# Patient Record
Sex: Female | Born: 1985 | Race: Black or African American | Hispanic: No | Marital: Married | Smoking: Never smoker
Health system: Southern US, Community
[De-identification: ages and names within clinical notes are randomized; demographics above are authoritative.]

## PROBLEM LIST (undated history)

## (undated) ENCOUNTER — Emergency Department (HOSPITAL_COMMUNITY): Discharge status: Absent without leave | Payer: No Typology Code available for payment source

## (undated) DIAGNOSIS — Z789 Other specified health status: Secondary | ICD-10-CM

---

## 2015-05-29 ENCOUNTER — Encounter (HOSPITAL_COMMUNITY): Payer: Self-pay | Admitting: Emergency Medicine

## 2015-05-29 ENCOUNTER — Emergency Department (HOSPITAL_COMMUNITY)
Admission: EM | Admit: 2015-05-29 | Discharge: 2015-05-29 | Disposition: A | Discharge status: Home / Self Care | Payer: Medicaid Other | Attending: Emergency Medicine | Admitting: Emergency Medicine

## 2015-05-29 DIAGNOSIS — R21 Rash and other nonspecific skin eruption: Secondary | ICD-10-CM | POA: Diagnosis present

## 2015-05-29 DIAGNOSIS — Y9389 Activity, other specified: Secondary | ICD-10-CM | POA: Diagnosis not present

## 2015-05-29 DIAGNOSIS — W57XXXA Bitten or stung by nonvenomous insect and other nonvenomous arthropods, initial encounter: Secondary | ICD-10-CM | POA: Insufficient documentation

## 2015-05-29 DIAGNOSIS — S40861A Insect bite (nonvenomous) of right upper arm, initial encounter: Secondary | ICD-10-CM | POA: Insufficient documentation

## 2015-05-29 DIAGNOSIS — Y998 Other external cause status: Secondary | ICD-10-CM | POA: Insufficient documentation

## 2015-05-29 DIAGNOSIS — Y9289 Other specified places as the place of occurrence of the external cause: Secondary | ICD-10-CM | POA: Diagnosis not present

## 2015-05-29 DIAGNOSIS — S40862A Insect bite (nonvenomous) of left upper arm, initial encounter: Secondary | ICD-10-CM | POA: Insufficient documentation

## 2015-05-29 MED ORDER — HYDROCORTISONE 2.5 % EX LOTN: TOPICAL_LOTION | Freq: Two times a day (BID) | CUTANEOUS | Status: DC

## 2015-05-29 NOTE — ED Notes (Signed)
Declined W/C at D/C and was escorted to lobby by RN. 

## 2015-05-29 NOTE — Discharge Instructions (Signed)

## 2015-05-29 NOTE — ED Provider Notes (Signed)
CSN: [id number]     Arrival date & time 05/29/15  1126 History  This chart was scribed for non-physician practitioner, Will Marijo File, PA-C, working with Pricilla Loveless, MD, by Ronney Lion, ED Scribe. This patient was seen in room TR07C/TR07C and the patient's care was started at 11:51 AM.    Chief Complaint  Patient presents with  . Rash   The history is provided by the patient. A language interpreter was used (tele-interpreter).    HPI Comments: Melanie Duncan is a 29 y.o. female who presents to the Emergency Department complaining of two constant pruritic bumps--one on her right upper arm and one on her left upper arm--that she first noticed yesterday while at work. They are not painful. No other rashes.  Patient has not tried any treatments for this. She denies any fever, myalgias, pain, SOB, sore throat, or abdominal pain.   History reviewed. No pertinent past medical history. History reviewed. No pertinent past surgical history. No family history on file. Social History  Substance Use Topics  . Smoking status: Never Smoker   . Smokeless tobacco: None  . Alcohol Use: No   OB History    No data available     Review of Systems  Constitutional: Negative for fever.  HENT: Negative for sore throat and trouble swallowing.   Eyes: Negative for visual disturbance.  Respiratory: Negative for cough and shortness of breath.   Gastrointestinal: Negative for abdominal pain.  Musculoskeletal: Negative for myalgias and arthralgias.  Skin: Positive for rash.   Allergies  Review of patient's allergies indicates not on file.  Home Medications   Prior to Admission medications   Medication Sig Start Date End Date Taking? Authorizing Provider  hydrocortisone 2.5 % lotion Apply topically 2 (two) times daily. 05/29/15   Everlene Farrier, PA-C   BP 127/72 mmHg  Pulse 79  Temp(Src) 98.5 F (36.9 C) (Oral)  Resp 18  Ht 5' (1.524 m)  Wt 153 lb (69.4 kg)  BMI 29.88 kg/m2  SpO2 96% Physical Exam   Constitutional: She appears well-developed and well-nourished. No distress.  Nontoxic appearing.  HENT:  Head: Normocephalic and atraumatic.  Right Ear: External ear normal.  Left Ear: External ear normal.  Mouth/Throat: Oropharynx is clear and moist.  Eyes: Conjunctivae are normal. Pupils are equal, round, and reactive to light. Right eye exhibits no discharge. Left eye exhibits no discharge.  Neck: Normal range of motion.  Cardiovascular: Normal rate, regular rhythm, normal heart sounds and intact distal pulses.   Pulmonary/Chest: Effort normal and breath sounds normal. No respiratory distress. She has no wheezes. She has no rales.  Abdominal: Soft. There is no tenderness. There is no guarding.  Musculoskeletal: She exhibits no edema.  Lymphadenopathy:    She has no cervical adenopathy.  Neurological: She is alert. Coordination normal.  Skin: Skin is warm and dry. No rash noted. She is not diaphoretic. No pallor.  Two areas which appear to be mosquito bites to her bilateral arms. Raised areas of induration about 1 cm in size with overlying erythema and no surrounding erythema. No discharge. No vesicles. Not tender to touch.   Psychiatric: She has a normal mood and affect. Her behavior is normal.  Nursing note and vitals reviewed.   ED Course  Procedures (including critical care time)  DIAGNOSTIC STUDIES: Oxygen Saturation is 96% on RA, normal by my interpretation.    COORDINATION OF CARE: 11:56 AM - Suspect mosquito bites. Discussed treatment plan with pt at bedside which includes Rx hydrocortisone  cream. Will give referral to [facility] where pt can establish can establish primary care. Pt has no further questions at this time; she verbalized understanding and agreed to plan.   MDM   Meds given in ED:  Medications - No data to display  New Prescriptions   HYDROCORTISONE 2.5 % LOTION    Apply topically 2 (two) times daily.    Final diagnoses:  Insect bite   This is  a 29 y.o. female who presents to the Emergency Department complaining of two constant pruritic bumps--one on her right upper arm and one on her left upper arm--that she first noticed yesterday while at work. They are not painful. No other rashes.  On exam the patient is afebrile nontoxic appearing. She has what appears to be 2 mosquito bites on her bilateral arms. They're raised areas of induration with overlying erythema without surrounding erythema. No discharge. No vesicles.  No blisters, no pustules, no warmth, no draining sinus tracts, no superficial abscesses, no bullous impetigo, no vesicles, no desquamation, no target lesions with dusky purpura or a central bulla. Not tender to touch.  Will discharge with hydrocortisone cream and have her follow-up with primary care. I advised the patient to follow-up with their primary care provider this week. I advised the patient to return to the emergency department with new or worsening symptoms or new concerns. The patient verbalized understanding and agreement with plan.    I personally performed the services described in this documentation, which was scribed in my presence. The recorded information has been reviewed and is accurate.    Everlene Farrier, PA-C 05/29/15 1213  Pricilla Loveless, MD 06/01/15 1630

## 2015-06-15 ENCOUNTER — Emergency Department (HOSPITAL_COMMUNITY): Payer: No Typology Code available for payment source

## 2015-06-15 ENCOUNTER — Encounter (HOSPITAL_COMMUNITY): Payer: Self-pay | Admitting: Family Medicine

## 2015-06-15 ENCOUNTER — Emergency Department (HOSPITAL_COMMUNITY)
Admission: EM | Admit: 2015-06-15 | Discharge: 2015-06-15 | Disposition: A | Discharge status: Home / Self Care | Payer: No Typology Code available for payment source | Attending: Emergency Medicine | Admitting: Emergency Medicine

## 2015-06-15 DIAGNOSIS — S0993XA Unspecified injury of face, initial encounter: Secondary | ICD-10-CM | POA: Diagnosis not present

## 2015-06-15 DIAGNOSIS — Y9389 Activity, other specified: Secondary | ICD-10-CM | POA: Diagnosis not present

## 2015-06-15 DIAGNOSIS — Y92410 Unspecified street and highway as the place of occurrence of the external cause: Secondary | ICD-10-CM | POA: Insufficient documentation

## 2015-06-15 DIAGNOSIS — Z3202 Encounter for pregnancy test, result negative: Secondary | ICD-10-CM | POA: Insufficient documentation

## 2015-06-15 DIAGNOSIS — Y998 Other external cause status: Secondary | ICD-10-CM | POA: Insufficient documentation

## 2015-06-15 LAB — I-STAT BETA HCG BLOOD, ED (MC, WL, AP ONLY)

## 2015-06-15 LAB — POC URINE PREG, ED: PREG TEST UR: NEGATIVE

## 2015-06-15 MED ORDER — ACETAMINOPHEN 500 MG PO TABS
1000.0000 mg | ORAL_TABLET | Freq: Once | ORAL | Status: AC
  Administered 2015-06-15: 1000 mg via ORAL
  Filled 2015-06-15: qty 2

## 2015-06-15 MED ORDER — METHOCARBAMOL 500 MG PO TABS: 500.0000 mg | ORAL_TABLET | Freq: Two times a day (BID) | ORAL | Status: DC

## 2015-06-15 MED ORDER — MELOXICAM 7.5 MG PO TABS: 7.5000 mg | ORAL_TABLET | Freq: Every day | ORAL | Status: DC

## 2015-06-15 NOTE — Discharge Instructions (Signed)
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
## 2015-07-31 ENCOUNTER — Encounter (HOSPITAL_COMMUNITY): Payer: Self-pay | Admitting: Neurology

## 2015-07-31 ENCOUNTER — Emergency Department (HOSPITAL_COMMUNITY): Payer: Medicaid Other

## 2015-07-31 ENCOUNTER — Emergency Department (HOSPITAL_COMMUNITY)
Admission: EM | Admit: 2015-07-31 | Discharge: 2015-07-31 | Disposition: A | Discharge status: Home / Self Care | Payer: Medicaid Other | Attending: Emergency Medicine | Admitting: Emergency Medicine

## 2015-07-31 DIAGNOSIS — R21 Rash and other nonspecific skin eruption: Secondary | ICD-10-CM | POA: Insufficient documentation

## 2015-07-31 DIAGNOSIS — N390 Urinary tract infection, site not specified: Secondary | ICD-10-CM | POA: Diagnosis not present

## 2015-07-31 DIAGNOSIS — H00036 Abscess of eyelid left eye, unspecified eyelid: Secondary | ICD-10-CM | POA: Diagnosis not present

## 2015-07-31 DIAGNOSIS — H05012 Cellulitis of left orbit: Secondary | ICD-10-CM

## 2015-07-31 DIAGNOSIS — R1030 Lower abdominal pain, unspecified: Secondary | ICD-10-CM | POA: Diagnosis present

## 2015-07-31 DIAGNOSIS — L03213 Periorbital cellulitis: Secondary | ICD-10-CM

## 2015-07-31 LAB — CBC
HCT: 37.8 % (ref 36.0–46.0)
HEMOGLOBIN: 12.8 g/dL (ref 12.0–15.0)
MCH: 26.1 pg (ref 26.0–34.0)
MCHC: 33.9 g/dL (ref 30.0–36.0)
MCV: 77.1 fL — ABNORMAL LOW (ref 78.0–100.0)
Platelets: 248 10*3/uL (ref 150–400)
RBC: 4.9 MIL/uL (ref 3.87–5.11)
RDW: 12.2 % (ref 11.5–15.5)
WBC: 6.6 10*3/uL (ref 4.0–10.5)

## 2015-07-31 LAB — URINE MICROSCOPIC-ADD ON

## 2015-07-31 LAB — COMPREHENSIVE METABOLIC PANEL
ALK PHOS: 85 U/L (ref 38–126)
ALT: 15 U/L (ref 14–54)
ANION GAP: 6 (ref 5–15)
AST: 21 U/L (ref 15–41)
Albumin: 3.7 g/dL (ref 3.5–5.0)
BILIRUBIN TOTAL: 1 mg/dL (ref 0.3–1.2)
BUN: <5 mg/dL — ABNORMAL LOW (ref 6–20)
CALCIUM: 8.9 mg/dL (ref 8.9–10.3)
CO2: 24 mmol/L (ref 22–32)
Chloride: 106 mmol/L (ref 101–111)
Creatinine, Ser: 0.71 mg/dL (ref 0.44–1.00)
GFR calc non Af Amer: >60 mL/min (ref >60)
Glucose, Bld: 103 mg/dL — ABNORMAL HIGH (ref 65–99)
Potassium: 4 mmol/L (ref 3.5–5.1)
SODIUM: 136 mmol/L (ref 135–145)
TOTAL PROTEIN: 6.6 g/dL (ref 6.5–8.1)

## 2015-07-31 LAB — URINALYSIS, ROUTINE W REFLEX MICROSCOPIC
Bilirubin Urine: NEGATIVE
Glucose, UA: NEGATIVE mg/dL
Hgb urine dipstick: NEGATIVE
Ketones, ur: NEGATIVE mg/dL
NITRITE: NEGATIVE
Protein, ur: NEGATIVE mg/dL
SPECIFIC GRAVITY, URINE: 1.009 (ref 1.005–1.030)
pH: 6.5 (ref 5.0–8.0)

## 2015-07-31 LAB — LIPASE, BLOOD: Lipase: 36 U/L (ref 11–51)

## 2015-07-31 LAB — I-STAT BETA HCG BLOOD, ED (MC, WL, AP ONLY)

## 2015-07-31 MED ORDER — SULFAMETHOXAZOLE-TRIMETHOPRIM 800-160 MG PO TABS
1.0000 | ORAL_TABLET | Freq: Once | ORAL | Status: AC
  Administered 2015-07-31: 1 via ORAL
  Filled 2015-07-31: qty 1

## 2015-07-31 MED ORDER — SODIUM CHLORIDE 0.9 % IV BOLUS (SEPSIS)
500.0000 mL | Freq: Once | INTRAVENOUS | Status: AC
  Administered 2015-07-31: 500 mL via INTRAVENOUS

## 2015-07-31 MED ORDER — IOHEXOL 300 MG/ML  SOLN
75.0000 mL | Freq: Once | INTRAMUSCULAR | Status: AC | PRN
Start: 2015-07-31 — End: 2015-07-31
  Administered 2015-07-31: 75 mL via INTRAVENOUS

## 2015-07-31 MED ORDER — KETOROLAC TROMETHAMINE 30 MG/ML IJ SOLN
30.0000 mg | Freq: Once | INTRAMUSCULAR | Status: AC
  Administered 2015-07-31: 30 mg via INTRAVENOUS
  Filled 2015-07-31: qty 1

## 2015-07-31 MED ORDER — HYDROCORTISONE 2.5 % EX LOTN: TOPICAL_LOTION | Freq: Two times a day (BID) | CUTANEOUS | Status: DC

## 2015-07-31 MED ORDER — SULFAMETHOXAZOLE-TRIMETHOPRIM 800-160 MG PO TABS: 1.0000 | ORAL_TABLET | Freq: Two times a day (BID) | ORAL | Status: AC

## 2015-07-31 MED ORDER — DIPHENHYDRAMINE HCL 50 MG/ML IJ SOLN
12.5000 mg | Freq: Once | INTRAMUSCULAR | Status: AC
  Administered 2015-07-31: 12.5 mg via INTRAVENOUS
  Filled 2015-07-31: qty 1

## 2015-07-31 NOTE — ED Notes (Signed)
MD at bedside, discussing discharge instructions with patient.

## 2015-07-31 NOTE — Discharge Instructions (Signed)
You were seen today for your eye swelling, lower abdominal pain, and rash.  Take the antibiotics as prescribed for the infection of the skin around your eye as well as your urinary tract infection.  Use baby wash to clean your eye lashes on the eye that has the surrounding infection.  Use the cream prescribed for your rash.  Make sure you follow up with a primary care physician outpatient for reevaluation to make sure your symptoms are improving.  Preseptal Cellulitis, Adult Preseptal cellulitis--also called periorbital cellulitis--is an infection that can affect your eyelid and the soft tissues or skin that surround your eye. The infection may also affect the structures that produce and drain your tears. It does not affect your eye itself. CAUSES This condition may be caused by:  Bacterial infection.  Long-term (chronic) sinus infections.  An object (foreign body) that is stuck behind the eye.  An injury that:  Goes through the eyelid tissues.  Causes an infection, such as an insect sting.  Fracture of the bone around the eye.  Infections that have spread from the eyelid or other structures around the eye.  Bite wounds.  Inflammation or infection of the lining membranes of the brain (meningitis).  An infection in the blood (septicemia).  Dental infection (abscess).  Viral infection. This is rare. RISK FACTORS Risk factors for preseptal cellulitis include:  Participating in activities that increase your risk of trauma to the face or head, such as boxing or high-speed activities.  Having a weakened defense system (immune system).  Medical conditions, such as nasal polyps, that increase your risk for frequent or recurrent sinus infections.  Not receiving regular dental care. SYMPTOMS Symptoms of this condition usually come on suddenly. Symptoms may include:  Red, hot, and swollen eyelids.  Fever.  Difficulty opening your eye.  Eye pain. DIAGNOSIS This condition may  be diagnosed by an eye exam. You may also have tests, such as:  Blood tests.  CT scan.  MRI.  Spinal tap (lumbar puncture). This is a procedure that involves removing and examining a small amount of the fluid that surrounds the brain and spinal cord. This checks for meningitis. TREATMENT Treatment for this condition will include antibiotic medicines. These may be given by mouth (orally), through an IV, or as a shot. Your health care provider may also recommend nasal decongestants to reduce swelling. HOME CARE INSTRUCTIONS  Take your antibiotic medicine as directed by your health care provider. Finish all of it even if you start to feel better.  Take medicines only as directed by your health care provider.  Drink enough fluid to keep your urine clear or pale yellow.  Do not use any tobacco products, including cigarettes, chewing tobacco, or electronic cigarettes. If you need help quitting, ask your health care provider.  Keep all follow-up visits as directed by your health care provider. These include any visits with an eye specialist (ophthalmologist) or dentist. SEEK MEDICAL CARE IF:  You have a fever.  Your eyelids become more red, warm, or swollen.  You have new symptoms.  Your symptoms do not get better with treatment. SEEK IMMEDIATE MEDICAL CARE IF:  You develop double vision, or your vision becomes blurred or worsens in any way.  You have trouble moving your eyes.  Your eye looks like it is sticking out or bulging out (proptosis).  You develop a severe headache, severe neck pain, or neck stiffness.  You develop repeated vomiting.   This information is not intended to replace  advice given to you by your health care provider. Make sure you discuss any questions you have with your health care provider.   Document Released: 09/23/2010 Document Revised: 01/05/2015 Document Reviewed: 08/17/2014 Elsevier Interactive Patient Education 2016 Else[employer] Inc[employer]    Urinary  Tract Infection Urinary tract infections (UTIs) can develop anywhere along your urinary tract. Your urinary tract is your body's drainage system for removing wastes and extra water. Your urinary tract includes two kidneys, two ureters, a bladder, and a urethra. Your kidneys are a pair of bean-shaped organs. Each kidney is about the size of your fist. They are located below your ribs, one on each side of your spine. CAUSES Infections are caused by microbes, which are microscopic organisms, including fungi, viruses, and bacteria. These organisms are so small that they can only be seen through a microscope. Bacteria are the microbes that most commonly cause UTIs. SYMPTOMS  Symptoms of UTIs may vary by age and gender of the patient and by the location of the infection. Symptoms in young women typically include a frequent and intense urge to urinate and a painful, burning feeling in the bladder or urethra during urination. Older women and men are more likely to be tired, shaky, and weak and have muscle aches and abdominal pain. A fever may mean the infection is in your kidneys. Other symptoms of a kidney infection include pain in your back or sides below the ribs, nausea, and vomiting. DIAGNOSIS To diagnose a UTI, your caregiver will ask you about your symptoms. Your caregiver will also ask you to provide a urine sample. The urine sample will be tested for bacteria and white blood cells. White blood cells are made by your body to help fight infection. TREATMENT  Typically, UTIs can be treated with medication. Because most UTIs are caused by a bacterial infection, they usually can be treated with the use of antibiotics. The choice of antibiotic and length of treatment depend on your symptoms and the type of bacteria causing your infection. HOME CARE INSTRUCTIONS  If you were prescribed antibiotics, take them exactly as your caregiver instructs you. Finish the medication even if you feel better after you have  only taken some of the medication.  Drink enough water and fluids to keep your urine clear or pale yellow.  Avoid caffeine, tea, and carbonated beverages. They tend to irritate your bladder.  Empty your bladder often. Avoid holding urine for long periods of time.  Empty your bladder before and after sexual intercourse.  After a bowel movement, women should cleanse from front to back. Use each tissue only once. SEEK MEDICAL CARE IF:   You have back pain.  You develop a fever.  Your symptoms do not begin to resolve within 3 days. SEEK IMMEDIATE MEDICAL CARE IF:   You have severe back pain or lower abdominal pain.  You develop chills.  You have nausea or vomiting.  You have continued burning or discomfort with urination. MAKE SURE YOU:   Understand these instructions.  Will watch your condition.  Will get help right away if you are not doing well or get worse.   This information is not intended to replace advice given to you by your health care provider. Make sure you discuss any questions you have with your health care provider.   Document Released: 05/31/2005 Document Revised: 05/12/2015 Document Reviewed: 09/29/2011 Elsevier Interactive Patient Education 2016 Else[employer] Inc.  Eczema Eczema, also called atopic dermatitis, is a skin disorder that causes inflammation of the skin.  It causes a red rash and dry, scaly skin. The skin becomes very itchy. Eczema is generally worse during the cooler winter months and often improves with the warmth of summer. Eczema usually starts showing signs in infancy. Some children outgrow eczema, but it may last through adulthood.  CAUSES  The exact cause of eczema is not known, but it appears to run in families. People with eczema often have a family history of eczema, allergies, asthma, or hay fever. Eczema is not contagious. Flare-ups of the condition may be caused by:   Contact with something you are sensitive or allergic to.    Stress. SIGNS AND SYMPTOMS  Dry, scaly skin.   Red, itchy rash.   Itchiness. This may occur before the skin rash and may be very intense.  DIAGNOSIS  The diagnosis of eczema is usually made based on symptoms and medical history. TREATMENT  Eczema cannot be cured, but symptoms usually can be controlled with treatment and other strategies. A treatment plan might include:  Controlling the itching and scratching.   Use over-the-counter antihistamines as directed for itching. This is especially useful at night when the itching tends to be worse.   Use over-the-counter steroid creams as directed for itching.   Avoid scratching. Scratching makes the rash and itching worse. It may also result in a skin infection (impetigo) due to a break in the skin caused by scratching.   Keeping the skin well moisturized with creams every day. This will seal in moisture and help prevent dryness. Lotions that contain alcohol and water should be avoided because they can dry the skin.   Limiting exposure to things that you are sensitive or allergic to (allergens).   Recognizing situations that cause stress.   Developing a plan to manage stress.  HOME CARE INSTRUCTIONS   Only take over-the-counter or prescription medicines as directed by your health care provider.   Do not use anything on the skin without checking with your health care provider.   Keep baths or showers short (5 minutes) in warm (not hot) water. Use mild cleansers for bathing. These should be unscented. You may add nonperfumed bath oil to the bath water. It is best to avoid soap and bubble bath.   Immediately after a bath or shower, when the skin is still damp, apply a moisturizing ointment to the entire body. This ointment should be a petroleum ointment. This will seal in moisture and help prevent dryness. The thicker the ointment, the better. These should be unscented.   Keep fingernails cut short. Children with  eczema may need to wear soft gloves or mittens at night after applying an ointment.   Dress in clothes made of cotton or cotton blends. Dress lightly, because heat increases itching.   A child with eczema should stay away from anyone with fever blisters or cold sores. The virus that causes fever blisters (herpes simplex) can cause a serious skin infection in children with eczema. SEEK MEDICAL CARE IF:   Your itching interferes with sleep.   Your rash gets worse or is not better within 1 week after starting treatment.   You see pus or soft yellow scabs in the rash area.   You have a fever.   You have a rash flare-up after contact with someone who has fever blisters.    This information is not intended to replace advice given to you by your health care provider. Make sure you discuss any questions you have with your health care provider.  Document Released: 08/18/2000 Document Revised: 06/11/2013 Document Reviewed: 03/24/2013 Elsevier Interactive Patient Education [employer]

## 2015-07-31 NOTE — ED Notes (Signed)
MD discussed with patient the discharge instructions and medications while on phone with interpreter. Patient verbalized understanding of instructions and prescriptions and denies any further needs or questions at this time. VS stable.

## 2015-07-31 NOTE — ED Notes (Signed)
MD at bedside. On phone with interpreter at this time.

## 2015-07-31 NOTE — ED Provider Notes (Signed)
CSN: [id number]     Arrival date & time 07/31/15  1131 History   First MD Initiated Contact with Patient 07/31/15 1150     Chief Complaint  Patient presents with  . Abdominal Pain  . Rash  . Eye Problem     (Consider location/radiation/quality/duration/timing/severity/associated sxs/prior Treatment) HPI Comments: 29 y.o. Female with no significant past medical history, Swahili speaking presents for abdominal pain, eye swelling, rash.  The patient reports that since yesterday she has had pain across her lower abdomen.  Denies change in appetite, fever, diarrhea, nausea, vomiting.  Reports she thinks she may be pregnant because she denies menstrual period since August.  Denies vaginal discharge or pain.  She also reports a recurrent, itchy rash to her right posterior thigh, right hand, and right arm.  Also over the last two or so days she has developed swelling of the eyelids of her eye without change in vision or pain in the eye.  She reports that her son had something similar happen to him.     History reviewed. No pertinent past medical history. Past Surgical History  Procedure Laterality Date  . Cesarean section     No family history on file. Social History  Substance Use Topics  . Smoking status: Never Smoker   . Smokeless tobacco: None  . Alcohol Use: No   OB History    No data available     Review of Systems  Constitutional: Negative for fever, chills, diaphoresis, appetite change and fatigue.  HENT: Positive for facial swelling (left eyelid). Negative for congestion, ear pain, postnasal drip, sinus pressure and sneezing.   Eyes: Positive for itching. Negative for photophobia, pain, discharge, redness and visual disturbance.  Respiratory: Negative for cough, chest tightness and shortness of breath.   Cardiovascular: Negative for chest pain and palpitations.  Gastrointestinal: Positive for abdominal pain (lower abdomen without focal area of pain). Negative for nausea,  vomiting, diarrhea, constipation and abdominal distention.  Genitourinary: Positive for frequency. Negative for dysuria, urgency, hematuria and flank pain.  Musculoskeletal: Negative for myalgias and back pain.  Skin: Positive for rash.  Neurological: Negative for dizziness, seizures, weakness, numbness and headaches.  Hematological: Does not bruise/bleed easily.      Allergies  Review of patient's allergies indicates no known allergies.  Home Medications   Prior to Admission medications   Medication Sig Start Date End Date Taking? Authorizing Provider  hydrocortisone 2.5 % lotion Apply topically 2 (two) times daily. Patient not taking: Reported on 06/15/2015 05/29/15   Everlene Farrier, PA-C  meloxicam (MOBIC) 7.5 MG tablet Take 1 tablet (7.5 mg total) by mouth daily. Patient not taking: Reported on 07/31/2015 06/15/15   April Palumbo, MD  methocarbamol (ROBAXIN) 500 MG tablet Take 1 tablet (500 mg total) by mouth 2 (two) times daily. Patient not taking: Reported on 07/31/2015 06/15/15   April Palumbo, MD   BP 109/59 mmHg  Pulse 66  Temp(Src) 98.7 F (37.1 C) (Oral)  Resp 14  SpO2 99%  LMP 04/05/2015 Physical Exam  Constitutional: She is oriented to person, place, and time. She appears well-developed and well-nourished. No distress.  HENT:  Head: Normocephalic and atraumatic.  Right Ear: External ear normal.  Left Ear: External ear normal.  Nose: Nose normal.  Mouth/Throat: Oropharynx is clear and moist. No oropharyngeal exudate.  Eyes: Conjunctivae and EOM are normal. Pupils are equal, round, and reactive to light. Right eye exhibits no chemosis, no discharge, no exudate and no hordeolum. Left eye exhibits no chemosis,  no discharge, no exudate and no hordeolum.  Neck: Normal range of motion. Neck supple.  Cardiovascular: Normal rate, regular rhythm, normal heart sounds and intact distal pulses.   No murmur heard. Pulmonary/Chest: Effort normal. No respiratory distress. She  has no wheezes. She has no rales.  Abdominal: Soft. She exhibits no distension. There is no tenderness.  Musculoskeletal: Normal range of motion. She exhibits no edema or tenderness.  Neurological: She is alert and oriented to person, place, and time.  Skin: Skin is warm and dry. Rash (patches of dry skin with numular lesions over the right dorsum of the hand, left arm, and right posterior thigh) noted. She is not diaphoretic.  Vitals reviewed.   ED Course  Procedures (including critical care time) Labs Review Labs Reviewed  COMPREHENSIVE METABOLIC PANEL - Abnormal; Notable for the following:    Glucose, Bld 103 (*)    BUN <5 (*)    All other components within normal limits  CBC - Abnormal; Notable for the following:    MCV 77.1 (*)    All other components within normal limits  URINALYSIS, ROUTINE W REFLEX MICROSCOPIC (NOT AT [facility]) - Abnormal; Notable for the following:    APPearance CLOUDY (*)    Leukocytes, UA TRACE (*)    All other components within normal limits  URINE MICROSCOPIC-ADD ON - Abnormal; Notable for the following:    Squamous Epithelial / LPF 6-30 (*)    Bacteria, UA RARE (*)    All other components within normal limits  LIPASE, BLOOD  I-STAT BETA HCG BLOOD, ED ([facility], [facility], [facility] ONLY)    Imaging Review Dg Abd Acute W/chest  07/31/2015  CLINICAL DATA:  Generalized abdominal pain EXAM: DG ABDOMEN ACUTE W/ 1V CHEST COMPARISON:  None. FINDINGS: The cardiac silhouette. There is mild bronchitic markings. No focal infiltrate. No pneumothorax. No free air beneath hemidiaphragms. Moderate volume stool in the RIGHT colon. No dilated loops of large or small bowel. Gas in the rectosigmoid colon. No pathologic calcifications. IMPRESSION: 1.  No acute cardiopulmonary process. 2. No bowel obstruction or free air. 3. Moderate stool in the RIGHT colon. Electronically Signed   By: Genevive BiStewart  Edmunds M.D.   On: 07/31/2015 13:18   Ct Orbits W/cm  07/31/2015  CLINICAL DATA:  Cellulitis about  the left eyelid with swelling and pain beginning the night of 07/30/2015. Initial encounter. EXAM: CT ORBITS WITH CONTRAST TECHNIQUE: Multidetector CT imaging of the orbits was performed following the bolus administration of intravenous contrast. CONTRAST:  75 mL OMNIPAQUE IOHEXOL 300 MG/ML  SOLN COMPARISON:  None. FINDINGS: There soft tissue swelling of the left lid which appears worse in the upper lid. Soft tissue swelling is preseptal. Orbital fat is clear. No focal fluid collection is identified. The globes are intact and the lenses are located. Extraocular muscles and the optic nerves appear normal. Imaged paranasal sinuses and mastoid air cells are clear. No bony abnormality is identified. Imaged intracranial contents appear normal. IMPRESSION: Soft tissue swelling of the left eye appears worse in the upper lid and is compatible with cellulitis of blepharitis. Negative for abscess or postseptal abnormality. Electronically Signed   By: Drusilla Kannerhomas  Dalessio M.D.   On: 07/31/2015 15:40   I have personally reviewed and evaluated these images and lab results as part of my medical decision-making.   EKG Interpretation None      MDM  Patient was seen and evaluated in stable condition.  No tenderness appreciated on abdominal examination.  Labs unremarkable other than UA  concerning for possible UTI and patient with symptoms.  HCG negative.  Ct orbit negative for orbital cellulitis.  PAtient was prescribed Bactirm for possible UTI and preseptal cellulitis.  Her rash was consistent with numular eczema and patient was provided a script for hydrocortisone cream.  All plans of care and clinical impression were discussed at length with patient via interpreter.  Patient expressed understanding and agreement.  Patient in agreement with plan of care.  Discharged to follow up with primary care outpatient. Final diagnoses:  Orbital cellulitis on left    1. Preseptal cellulitis, left  2. Nummular eczema  3. Possible  UTI    Leta Baptist, MD 08/02/15 [phone]

## 2015-07-31 NOTE — ED Notes (Addendum)
Pt is non-english speaking. Speaks swahili using intrepreter. C/o generalized abd pain since last night. Denies n/v/d, last BM today. LMP was in august, is unsure if she is pregnant. Also has rash to right posterior upper leg and pain to right 2nd finger. Has swelling to left eye lid since last night.
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
## 2021-07-15 ENCOUNTER — Encounter (HOSPITAL_COMMUNITY): Payer: Self-pay | Admitting: Obstetrics and Gynecology

## 2021-07-15 ENCOUNTER — Other Ambulatory Visit: Payer: Self-pay

## 2021-07-15 ENCOUNTER — Inpatient Hospital Stay (HOSPITAL_COMMUNITY)
Admission: AD | Admit: 2021-07-15 | Discharge: 2021-07-15 | Disposition: A | Discharge status: Home / Self Care | Payer: Medicaid Other | Attending: Obstetrics and Gynecology | Admitting: Obstetrics and Gynecology

## 2021-07-15 DIAGNOSIS — O26892 Other specified pregnancy related conditions, second trimester: Secondary | ICD-10-CM | POA: Diagnosis present

## 2021-07-15 DIAGNOSIS — R079 Chest pain, unspecified: Secondary | ICD-10-CM | POA: Insufficient documentation

## 2021-07-15 DIAGNOSIS — K5900 Constipation, unspecified: Secondary | ICD-10-CM | POA: Insufficient documentation

## 2021-07-15 DIAGNOSIS — R059 Cough, unspecified: Secondary | ICD-10-CM | POA: Diagnosis not present

## 2021-07-15 DIAGNOSIS — O99612 Diseases of the digestive system complicating pregnancy, second trimester: Secondary | ICD-10-CM | POA: Diagnosis not present

## 2021-07-15 DIAGNOSIS — M940 Chondrocostal junction syndrome [Tietze]: Secondary | ICD-10-CM | POA: Insufficient documentation

## 2021-07-15 DIAGNOSIS — J101 Influenza due to other identified influenza virus with other respiratory manifestations: Secondary | ICD-10-CM | POA: Diagnosis not present

## 2021-07-15 DIAGNOSIS — O99512 Diseases of the respiratory system complicating pregnancy, second trimester: Secondary | ICD-10-CM | POA: Diagnosis not present

## 2021-07-15 DIAGNOSIS — Z3A20 20 weeks gestation of pregnancy: Secondary | ICD-10-CM | POA: Diagnosis not present

## 2021-07-15 DIAGNOSIS — K921 Melena: Secondary | ICD-10-CM | POA: Diagnosis not present

## 2021-07-15 DIAGNOSIS — O219 Vomiting of pregnancy, unspecified: Secondary | ICD-10-CM | POA: Insufficient documentation

## 2021-07-15 HISTORY — DX: Other specified health status: Z78.9

## 2021-07-15 LAB — URINALYSIS, ROUTINE W REFLEX MICROSCOPIC
Bilirubin Urine: NEGATIVE
Glucose, UA: NEGATIVE mg/dL
Hgb urine dipstick: NEGATIVE
Ketones, ur: 80 mg/dL — AB
Leukocytes,Ua: NEGATIVE
Nitrite: NEGATIVE
Protein, ur: NEGATIVE mg/dL
Specific Gravity, Urine: 1.008 (ref 1.005–1.030)
pH: 6 (ref 5.0–8.0)

## 2021-07-15 LAB — POCT PREGNANCY, URINE: Preg Test, Ur: POSITIVE — AB

## 2021-07-15 MED ORDER — ONDANSETRON HCL 4 MG/2ML IJ SOLN
4.0000 mg | Freq: Once | INTRAMUSCULAR | Status: AC
  Administered 2021-07-15: 4 mg via INTRAVENOUS
  Filled 2021-07-15: qty 2

## 2021-07-15 MED ORDER — LACTATED RINGERS IV BOLUS
1000.0000 mL | Freq: Once | INTRAVENOUS | Status: AC
  Administered 2021-07-15: 1000 mL via INTRAVENOUS

## 2021-07-15 MED ORDER — M.V.I. ADULT IV INJ
Freq: Once | INTRAVENOUS | Status: AC
  Filled 2021-07-15: qty 10

## 2021-07-15 MED ORDER — BENZONATATE 100 MG PO CAPS: 100.0000 mg | ORAL_CAPSULE | Freq: Three times a day (TID) | ORAL | 0 refills | Status: AC

## 2021-07-15 MED ORDER — PROMETHAZINE HCL 25 MG PO TABS
25.0000 mg | ORAL_TABLET | Freq: Four times a day (QID) | ORAL | 0 refills | Status: AC | PRN
Start: 2021-07-15 — End: ?

## 2021-07-15 NOTE — MAU Provider Note (Signed)
History     CSN: [id number]  Arrival date and time: 07/15/21 1145   Event Date/Time   First Provider Initiated Contact with Patient 07/15/21 1622      Chief Complaint  Patient presents with   Cough   Blood in stool   Chest Pain   HPI Melanie Duncan is a 35 y.o. [id number] at [redacted]w[redacted]d who presents to MAU with chief complaints of vomiting and productive cough in the setting of Flu A diagnosis yesterday. Patient states symptoms started two weeks ago. She feels as if her symptoms have worsened over time. She sought evaluation at a clinic in [facility] yesterday and was prescribed Zofran. She has been taking it BID with limited success. She is able to tolerate PO throughout the day but cannot go an entire day without vomiting.  Patient reports chest tightness during episodes of coughing. She denies chest pain. She denies palpitations, weakness, syncope.   Patient reports constipation. She has visualized blood on her stool when she wipes after bowel movements. She is unaware of interventions for constipation.  She denies abdominal pain, vaginal bleeding, dysuria, fever.   Patient receives care in [facility].  OB History     Gravida  6   Para  5   Term  5   Preterm      AB      Living  4      SAB      IAB      Ectopic      Multiple      Live Births  4           Past Medical History:  Diagnosis Date   Medical history non-contributory     Past Surgical History:  Procedure Laterality Date   CESAREAN SECTION      No family history on file.  Social History   Tobacco Use   Smoking status: Never  Substance Use Topics   Alcohol use: No   Drug use: No    Allergies: No Known Allergies  Medications Prior to Admission  Medication Sig Dispense Refill Last Dose   ondansetron (ZOFRAN-ODT) 4 MG disintegrating tablet Take 4 mg by mouth every 8 (eight) hours as needed for nausea or vomiting.      hydrocortisone 2.5 % lotion Apply topically 2 (two) times daily.  59 mL 0    meloxicam (MOBIC) 7.5 MG tablet Take 1 tablet (7.5 mg total) by mouth daily. (Patient not taking: Reported on 07/31/2015) 10 tablet 0    methocarbamol (ROBAXIN) 500 MG tablet Take 1 tablet (500 mg total) by mouth 2 (two) times daily. (Patient not taking: Reported on 07/31/2015) 20 tablet 0    sulfamethoxazole-trimethoprim (BACTRIM DS,SEPTRA DS) 800-160 MG tablet Take 1 tablet by mouth 2 (two) times daily. 20 tablet 0     Review of Systems  Constitutional:  Positive for fatigue.  Respiratory:  Positive for cough.   All other systems reviewed and are negative. Physical Exam   Blood pressure 114/66, pulse 90, temperature 98.3 F (36.8 C), temperature source Oral, resp. rate 20, weight 83 kg, last menstrual period 02/23/2021, SpO2 99 %.  Physical Exam Vitals and nursing note reviewed. Exam conducted with a chaperone present.  Constitutional:      General: She is not in acute distress.    Appearance: Normal appearance. She is obese. She is not ill-appearing.  Cardiovascular:     Rate and Rhythm: Normal rate and regular rhythm.     Pulses: Normal pulses.  Heart sounds: Normal heart sounds.  Pulmonary:     Effort: Pulmonary effort is normal.     Breath sounds: Normal breath sounds.  Abdominal:     Comments: Gravid  Skin:    Capillary Refill: Capillary refill takes less than 2 seconds.  Neurological:     Mental Status: She is alert and oriented to person, place, and time.  Psychiatric:        Mood and Affect: Mood normal.        Behavior: Behavior normal.        Thought Content: Thought content normal.        Judgment: Judgment normal.    MAU Course  Procedures  Orders Placed This Encounter  Procedures   Urinalysis, Routine w reflex microscopic Urine, Clean Catch   Droplet Isolation   Pregnancy, urine POC   Insert peripheral IV   Discharge patient   Patient Vitals for the past 24 hrs:  BP Temp Temp src Pulse Resp SpO2 Weight  07/15/21 1650 -- -- -- -- 18 --  --  07/15/21 1317 114/66 98.3 F (36.8 C) Oral 90 20 99 % 83 kg   Results for orders placed or performed during the hospital encounter of 07/15/21 (from the past 24 hour(s))  Pregnancy, urine POC     Status: Abnormal   Collection Time: 07/15/21 12:06 PM  Result Value Ref Range   Preg Test, Ur POSITIVE (A) NEGATIVE  Urinalysis, Routine w reflex microscopic Urine, Clean Catch     Status: Abnormal   Collection Time: 07/15/21 12:42 PM  Result Value Ref Range   Color, Urine YELLOW YELLOW   APPearance HAZY (A) CLEAR   Specific Gravity, Urine 1.008 1.005 - 1.030   pH 6.0 5.0 - 8.0   Glucose, UA NEGATIVE NEGATIVE mg/dL   Hgb urine dipstick NEGATIVE NEGATIVE   Bilirubin Urine NEGATIVE NEGATIVE   Ketones, ur 80 (A) NEGATIVE mg/dL   Protein, ur NEGATIVE NEGATIVE mg/dL   Nitrite NEGATIVE NEGATIVE   Leukocytes,Ua NEGATIVE NEGATIVE   Meds ordered this encounter  Medications   lactated ringers bolus 1,000 mL   M.V.I. Adult (INFUVITE ADULT) 10 mL in lactated ringers 1,000 mL infusion   ondansetron (ZOFRAN) injection 4 mg   promethazine (PHENERGAN) 25 MG tablet    Sig: Take 1 tablet (25 mg total) by mouth every 6 (six) hours as needed for nausea or vomiting.    Dispense:  30 tablet    Refill:  0    Order Specific Question:   Supervising Provider    Answer:   [place] Bing [[id number]]   benzonatate (TESSALON) 100 MG capsule    Sig: Take 1 capsule (100 mg total) by mouth every 8 (eight) hours.    Dispense:  21 capsule    Refill:  0    Order Specific Question:   Supervising Provider    Answer:   [place] Bing [[id number]]   Assessment and Plan  --35 y.o. [id number] at [redacted]w[redacted]d  --FHT 172 by Doppler --Flu A, no acute symptoms --Intermittently productive cough --Costochondritis --Vomiting, ketonuria --S/p rehydration with IV fluids --No episodes of vomiting during evaluation in MAU --Continue Zofran, take as often as able. New rx Phenergan PRN --Language barrier: remote Swahili interpreter  utilized for all patient interaction --Discharge home in stable condition  Calvert Cantor, [place] 07/15/2021, 7:22 PM

## 2021-07-15 NOTE — Discharge Instructions (Signed)

## 2021-07-15 NOTE — MAU Note (Signed)
Presents stating she's having cough and chest pain.  Reports cough is productive.  Seen in [facility] and given medicine. States she also had a hard stool yesterday that had blood in it.  Reports had difficulty having BM. Denies VB or LOF.
